# Patient Record
Sex: Female | Born: 1995 | Race: White | Hispanic: No | Marital: Single | Smoking: Never smoker
Health system: Southern US, Community
[De-identification: ages and names within clinical notes are randomized; demographics above are authoritative.]

## PROBLEM LIST (undated history)

## (undated) DIAGNOSIS — Z973 Presence of spectacles and contact lenses: Secondary | ICD-10-CM

## (undated) HISTORY — DX: Presence of spectacles and contact lenses: Z97.3

---
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
## 2018-12-11 ENCOUNTER — Encounter: Payer: Self-pay | Admitting: Family Medicine

## 2018-12-11 ENCOUNTER — Ambulatory Visit: Payer: BLUE CROSS/BLUE SHIELD | Admitting: Family Medicine

## 2018-12-11 ENCOUNTER — Ambulatory Visit
Admission: RE | Admit: 2018-12-11 | Discharge: 2018-12-11 | Disposition: A | Discharge status: Home / Self Care | Payer: BLUE CROSS/BLUE SHIELD | Source: Ambulatory Visit | Attending: Family Medicine | Admitting: Family Medicine

## 2018-12-11 VITALS — BP 108/70 | HR 62 | Temp 98.8°F | Wt 152.2 lb

## 2018-12-11 DIAGNOSIS — M79672 Pain in left foot: Secondary | ICD-10-CM

## 2018-12-11 NOTE — Progress Notes (Signed)
   Subjective:    Patient ID: Jane Joyce, female    DOB: 02/17/1996, 23 y.o.   MRN: [id number]  HPI She is here for consult concerning an ankle injury that occurred December 6.  She apparently rolled her ankle causing an inversion type of an injury.  She was seen in an urgent care center and apparently x-rays were negative.  She was given a boot for 1 week to use.  She however has continued to have difficulty with foot pain and points to the midfoot area.   Review of Systems     Objective:   Physical Exam Alert and in no distress.  No tenderness over lateral or medial malleolus.  No tenderness over ATF.  No laxity noted.  Tender to palpation over the proximal fourth metatarsal and to a lesser extent over the fifth metatarsal.       Assessment & Plan:  Left foot pain - Plan: DG Foot Complete Left, Ambulatory referral to Orthopedic Surgery I explained that an x-ray is needed but the fact that she is still having pain 2 months after the injury states that there is either a bony abnormality or possible soft tissue and therefore orthopedic referral is appropriate.

## 2018-12-17 ENCOUNTER — Encounter: Payer: Self-pay | Admitting: Family Medicine

## 2018-12-18 ENCOUNTER — Ambulatory Visit: Payer: 59 | Admitting: Medical

## 2018-12-30 ENCOUNTER — Telehealth (INDEPENDENT_AMBULATORY_CARE_PROVIDER_SITE_OTHER): Payer: Self-pay | Admitting: Orthopedic Surgery

## 2018-12-30 NOTE — Telephone Encounter (Signed)
Returned call to patient got recording mail box is full and can not except messages at this time  Patient advised need to reschedule appointment  [phone]

## 2018-12-31 ENCOUNTER — Ambulatory Visit (INDEPENDENT_AMBULATORY_CARE_PROVIDER_SITE_OTHER): Payer: BLUE CROSS/BLUE SHIELD | Admitting: Orthopedic Surgery

## 2019-01-14 ENCOUNTER — Encounter (INDEPENDENT_AMBULATORY_CARE_PROVIDER_SITE_OTHER): Payer: Self-pay | Admitting: Orthopedic Surgery

## 2019-01-14 ENCOUNTER — Ambulatory Visit (INDEPENDENT_AMBULATORY_CARE_PROVIDER_SITE_OTHER): Payer: BC Managed Care – PPO | Admitting: Physician Assistant

## 2019-01-14 VITALS — Ht 66.0 in | Wt 152.2 lb

## 2019-01-14 DIAGNOSIS — S93602A Unspecified sprain of left foot, initial encounter: Secondary | ICD-10-CM | POA: Diagnosis not present

## 2019-01-14 NOTE — Progress Notes (Signed)
Office Visit Note   Patient: Jane Joyce           Date of Birth: Aug 16, 1996           MRN: [id number] Visit Date: 01/14/2019              Requested by: Denita Lung, MD [address] [place], [place] [address] PCP: Denita Lung, MD  Chief Complaint  Patient presents with  . 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  She reports the foot was swollen and hurting following this.  She was seen at urgent care and radiographs were done and were negative but she was placed in a fracture boot for 1 week.  She continued to have pain after coming out of her fracture boot especially when she is been trying to jog.  She reports she normally runs in Gray running shoes but is having pain over the ball of her foot in the midfoot 3rd-4th metatarsal area.  She reports she is tried to jog several times but cannot due to the discomfort.  She has been trying some ibuprofen as needed for pain.  Assessment & Plan: Visit Diagnoses:  1. Foot sprain, left, initial encounter     Plan: Counseled the patient to utilize a stiff soled sneaker such as a Hoka or new balance with a sole insert.  Also counseled the patient in Achilles stretches as she is tighter over the left than the right.  She will do the Achilles stretches about 5 times daily for about a minute at a time and try these measures when attempting to resume her jogging.  If this does not improve she can follow-up.  Follow-Up Instructions: Return if symptoms worsen or fail to improve.   Ortho Exam  Patient is alert, oriented, no adenopathy, well-dressed, normal affect, normal respiratory effort. Patient has some minimal tenderness to palpation over the third and fourth metatarsal head and over the lateral dorsal portion of her forefoot.  She does not appear to  have instability with varus valgus stress in the forefoot.  The foot is plantigrade.  She does have some minimal tightness over the left Achilles and stretches were reviewed with the patient.  She has good pedal pulses.  There are no signs of cellulitis or infection.  The ankle motion is otherwise good and pain-free as is subtalar motion.  Imaging: No results found. No images are attached to the encounter.  Labs: No results found for: HGBA1C, ESRSEDRATE, CRP, LABURIC, REPTSTATUS, GRAMSTAIN, CULT, LABORGA   No results found for: ALBUMIN, PREALBUMIN, LABURIC  Body mass index is 24.57 kg/m.  Orders:  No orders of the defined types were placed in this encounter.  No orders of the defined types were placed in this encounter.    Procedures: No procedures performed  Clinical Data: No additional findings.  ROS:  All other systems negative, except as noted in the HPI. Review of Systems  Objective: Vital Signs: Ht 5\' 6"  (1.676 m)   Wt 152 lb 3.2 oz (69 kg)   BMI 24.57 kg/m   Specialty Comments:  No specialty comments available.  PMFS History: Patient Active Problem List   Diagnosis Date Noted  . Need for meningococcal vaccination 01/03/2016   Past Medical History:  Diagnosis  Date  . Wears glasses     Family History  Problem Relation Age of Onset  . Heart disease Maternal Grandfather   . Diabetes Maternal Grandfather   . Cancer Neg Hx   . Stroke Neg Hx     Past Surgical History:  Procedure Laterality Date  . NO PAST SURGERIES  12/2015   Social History   Occupational History  . Not on file  Tobacco Use  . Smoking status: Never Smoker  . Smokeless tobacco: Never Used  Substance and Sexual Activity  . Alcohol use: No  . Drug use: No  . Sexual activity: Never

## 2019-01-17 ENCOUNTER — Encounter (INDEPENDENT_AMBULATORY_CARE_PROVIDER_SITE_OTHER): Payer: Self-pay | Admitting: Physician Assistant

## 2019-03-04 DIAGNOSIS — N3000 Acute cystitis without hematuria: Secondary | ICD-10-CM | POA: Diagnosis not present

## 2019-03-04 DIAGNOSIS — R350 Frequency of micturition: Secondary | ICD-10-CM | POA: Diagnosis not present

## 2019-05-31 DIAGNOSIS — Z1159 Encounter for screening for other viral diseases: Secondary | ICD-10-CM | POA: Diagnosis not present

## 2019-09-16 DIAGNOSIS — Z01419 Encounter for gynecological examination (general) (routine) without abnormal findings: Secondary | ICD-10-CM | POA: Diagnosis not present

## 2019-09-16 DIAGNOSIS — Z113 Encounter for screening for infections with a predominantly sexual mode of transmission: Secondary | ICD-10-CM | POA: Diagnosis not present

## 2019-09-16 DIAGNOSIS — Z1151 Encounter for screening for human papillomavirus (HPV): Secondary | ICD-10-CM | POA: Diagnosis not present

## 2019-09-16 DIAGNOSIS — Z6825 Body mass index (BMI) 25.0-25.9, adult: Secondary | ICD-10-CM | POA: Diagnosis not present

## 2019-09-24 DIAGNOSIS — R300 Dysuria: Secondary | ICD-10-CM | POA: Diagnosis not present

## 2019-09-24 DIAGNOSIS — N3001 Acute cystitis with hematuria: Secondary | ICD-10-CM | POA: Diagnosis not present

## 2020-07-21 IMAGING — CR DG FOOT COMPLETE 3+V*L*
3 series · 3 of 3 positions shown · non-contrast
Comparison: None.

CLINICAL DATA: Foot pain

EXAM:
LEFT FOOT - COMPLETE 3+ VIEW

[x foot ap left]
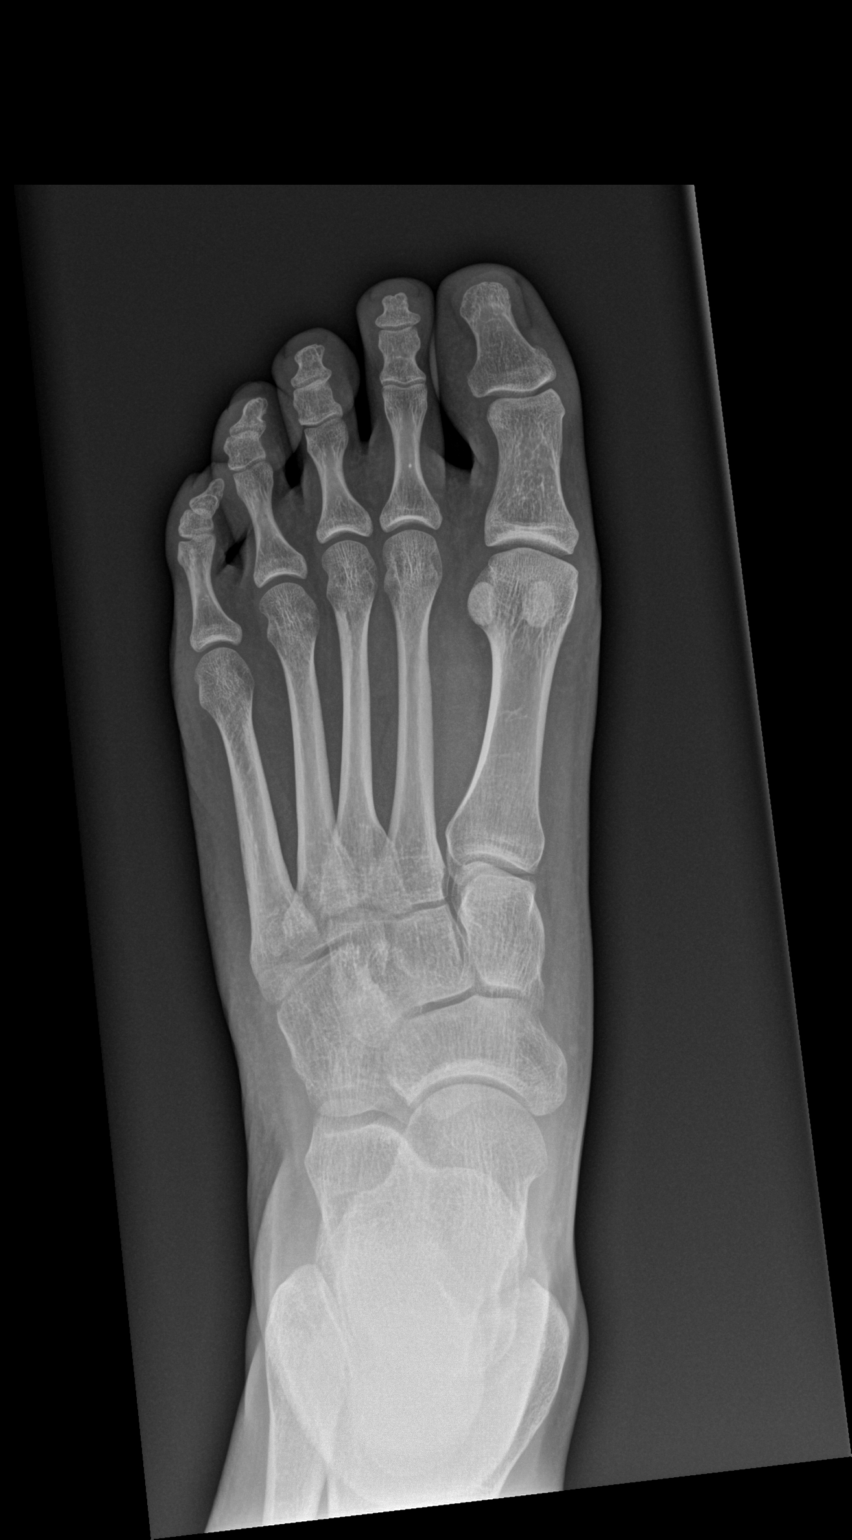

[x foot obl left]
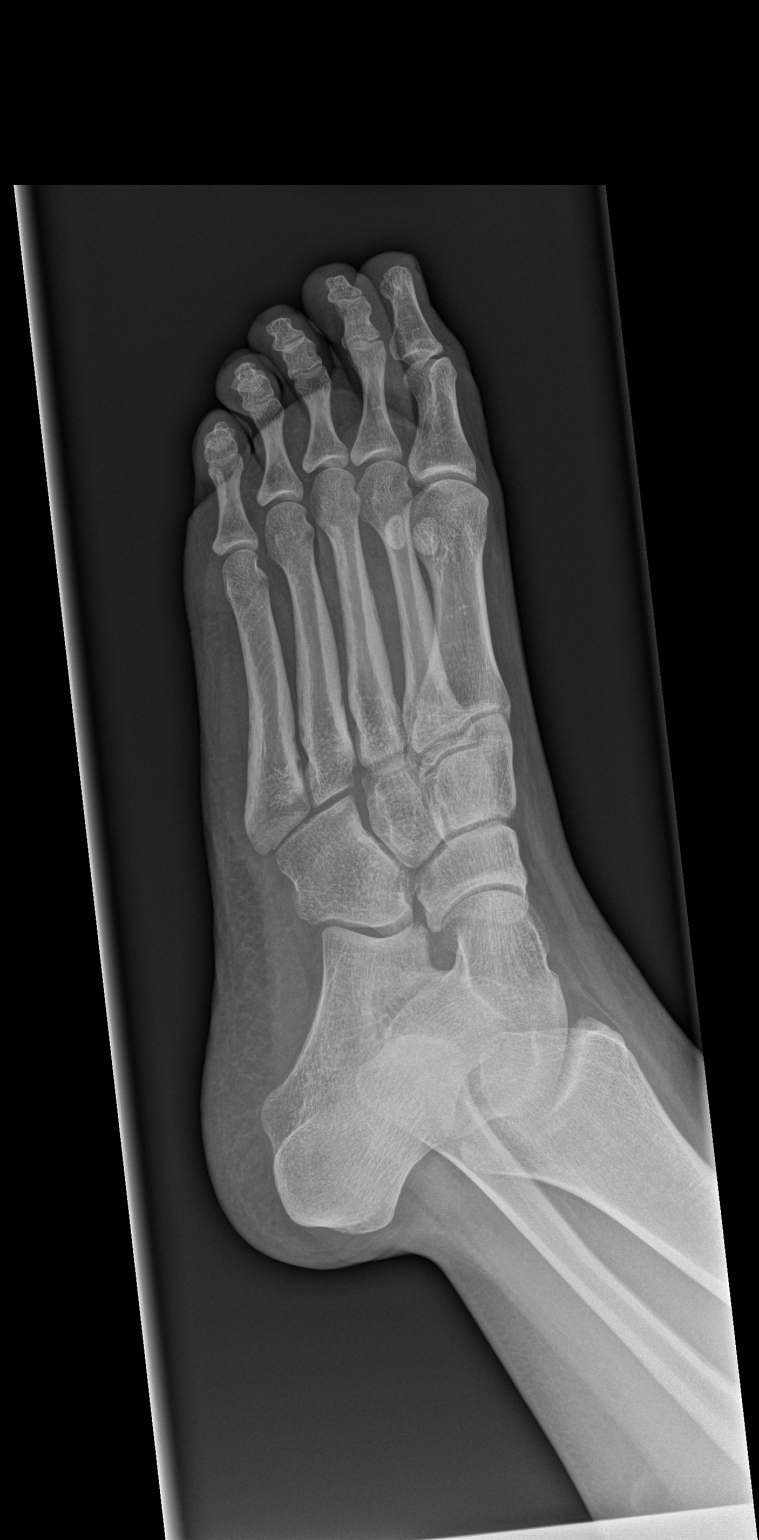

[x foot lat left]
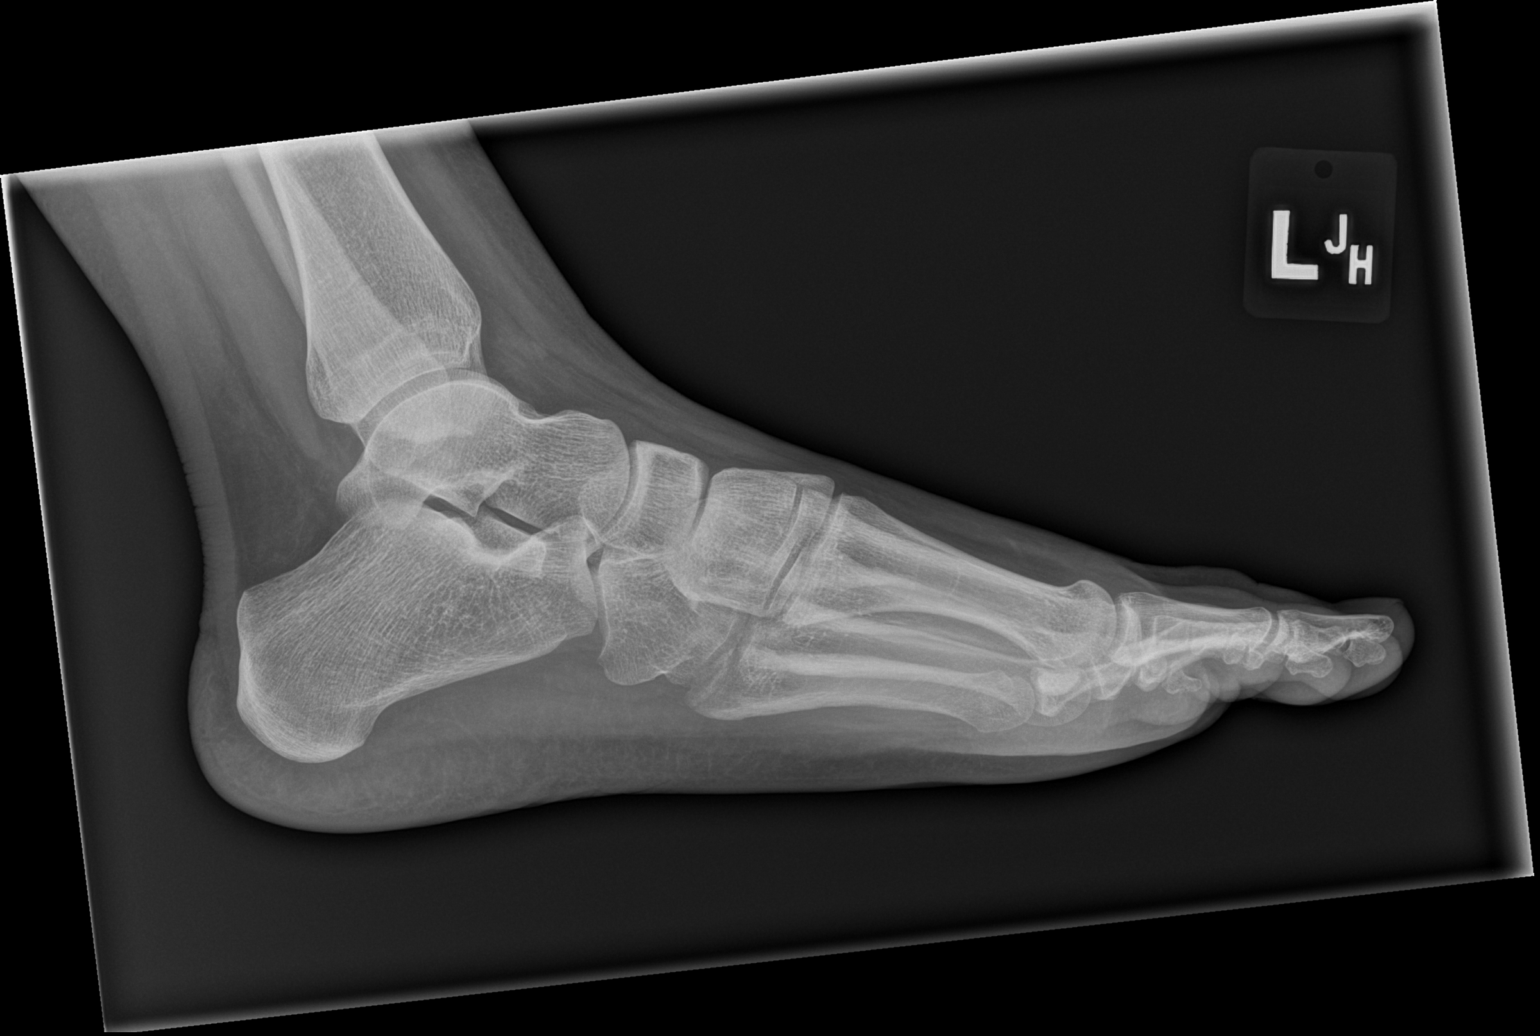

[3 of 3 positions shown; findings below may reference images not displayed]

FINDINGS: There is no evidence of fracture or dislocation. There is no
evidence of arthropathy or other focal bone abnormality. Soft
tissues are unremarkable.
IMPRESSION: Negative.
